# Patient Record
Sex: Male | Born: 2000 | Race: Black or African American | Hispanic: No | Marital: Single | Smoking: Never smoker
Health system: Southern US, Community
[De-identification: ages and names within clinical notes are randomized; demographics above are authoritative.]

## PROBLEM LIST (undated history)

## (undated) DIAGNOSIS — J302 Other seasonal allergic rhinitis: Secondary | ICD-10-CM

## (undated) HISTORY — PX: TYMPANOSTOMY TUBE PLACEMENT: SHX32

## (undated) HISTORY — PX: ADENOIDECTOMY: SHX5191

---
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
## 2022-04-06 ENCOUNTER — Telehealth: Payer: Self-pay | Admitting: Oncology

## 2022-04-06 NOTE — Telephone Encounter (Signed)
Scheduled appt per 7/11 referral. Pt's mother is aware of appt date and time. Pt's mother is aware to arrive 15 mins prior to appt time and to bring and updated insurance card. Pt's mother is aware of appt location.

## 2022-04-15 ENCOUNTER — Telehealth: Payer: Self-pay | Admitting: Hematology and Oncology

## 2022-04-15 ENCOUNTER — Telehealth: Payer: Self-pay | Admitting: Oncology

## 2022-04-15 NOTE — Telephone Encounter (Signed)
Scheduled appt per 7/21 referral. Pt is aware of appt date and time. Pt is aware to arrive 15 mins prior to appt time and to bring and updated insurance card. Pt is aware of appt location.   

## 2022-04-15 NOTE — Telephone Encounter (Signed)
R/s pt's new hem appt. Pt's mother is aware of new appt date/time.

## 2022-04-27 ENCOUNTER — Inpatient Hospital Stay: Payer: Self-pay | Admitting: Oncology

## 2022-05-03 ENCOUNTER — Inpatient Hospital Stay: Payer: Self-pay

## 2022-05-03 ENCOUNTER — Inpatient Hospital Stay: Payer: Self-pay | Attending: Oncology | Admitting: Hematology and Oncology

## 2022-05-03 ENCOUNTER — Other Ambulatory Visit: Payer: Self-pay

## 2022-05-03 DIAGNOSIS — D709 Neutropenia, unspecified: Secondary | ICD-10-CM | POA: Insufficient documentation

## 2022-05-03 LAB — CBC WITH DIFFERENTIAL (CANCER CENTER ONLY)
Abs Immature Granulocytes: 0 10*3/uL (ref 0.00–0.07)
Basophils Absolute: 0 10*3/uL (ref 0.0–0.1)
Basophils Relative: 1 %
Eosinophils Absolute: 0.1 10*3/uL (ref 0.0–0.5)
Eosinophils Relative: 5 %
HCT: 40.1 % (ref 39.0–52.0)
Hemoglobin: 13.1 g/dL (ref 13.0–17.0)
Immature Granulocytes: 0 %
Lymphocytes Relative: 56 %
Lymphs Abs: 1.2 10*3/uL (ref 0.7–4.0)
MCH: 26.8 pg (ref 26.0–34.0)
MCHC: 32.7 g/dL (ref 30.0–36.0)
MCV: 82.2 fL (ref 80.0–100.0)
Monocytes Absolute: 0.2 10*3/uL (ref 0.1–1.0)
Monocytes Relative: 10 %
Neutro Abs: 0.6 10*3/uL — ABNORMAL LOW (ref 1.7–7.7)
Neutrophils Relative %: 28 %
Platelet Count: 224 10*3/uL (ref 150–400)
RBC: 4.88 MIL/uL (ref 4.22–5.81)
RDW: 13.6 % (ref 11.5–15.5)
WBC Count: 2.2 10*3/uL — ABNORMAL LOW (ref 4.0–10.5)
nRBC: 0 % (ref 0.0–0.2)

## 2022-05-03 LAB — FOLATE: Folate: 13.5 ng/mL (ref >5.9)

## 2022-05-03 LAB — VITAMIN B12: Vitamin B-12: 256 pg/mL (ref 180–914)

## 2022-05-03 NOTE — Assessment & Plan Note (Addendum)
Lab review 06/18/2019: WBC 2.6, hemoglobin 13.9, platelets 255, ANC 1000 02/15/2022: WBC 3.5, hemoglobin 13.5, platelets 276, ANC 950, iron saturation 36% 03/08/2022: WBC 3.5, ANC 949  Persistent mild leukopenia/neutropenia: I discussed with the patient the differential diagnosis of leukopenia. Differential diagnosis: 1. Infections: No recent infections but she has had this low white count for at least 3 years or longer. 2. inflammation/autoimmune  3. Nutritional causes but B-12 or folic acid deficiency 4. Medication induced: Patient does not take any medications 5. Bone marrow disorders  Workup today: 1. CBC with differential with smear 2. B-12 and folic acid levels 3. ANA  Telephone visit on Friday to discuss the results of the blood work.

## 2022-05-03 NOTE — Progress Notes (Signed)
[facility] CONSULT NOTE  Patient Care Team: Currie Paris, MD (Inactive) as PCP - General (Emergency Medicine)  CHIEF COMPLAINTS/PURPOSE OF CONSULTATION:  Leukopenia  HISTORY OF PRESENTING ILLNESS:  Wesley Dalton 21 y.o. male is here because of leukopenia.  Patient has been in his usual state of health and on routine lab work was found to have low white blood cell count.  His white blood cell counts were repeated and found to be still low and therefore he was referred to [place] for consultation.  Even 3 years ago he had low white blood cell count with low neutrophils.  He has had no illnesses or infections.  He does not go to school or work but spends most of his time at home.  He has not had any major health issues.  He does not sleep at night and plays video games all night.  He denies any fevers or chills.  He denies any enlarged lymph nodes.  I reviewed her records extensively and collaborated the history with the patient.  MEDICAL HISTORY:  Past Medical History:  Diagnosis Date   Seasonal allergies     SURGICAL HISTORY: Past Surgical History:  Procedure Laterality Date   ADENOIDECTOMY     TYMPANOSTOMY TUBE PLACEMENT      SOCIAL HISTORY: Social History   Socioeconomic History   Marital status: Single    Spouse name: Not on file   Number of children: Not on file   Years of education: Not on file   Highest education level: Not on file  Occupational History   Not on file  Tobacco Use   Smoking status: Passive Smoke Exposure - Never Smoker   Smokeless tobacco: Not on file  Substance and Sexual Activity   Alcohol use: Not on file   Drug use: Not on file   Sexual activity: Not on file  Other Topics Concern   Not on file  Social History Narrative   Not on file   Social Determinants of Health   Financial Resource Strain: Not on file  Food Insecurity: Not on file  Transportation Needs: Not on file  Physical Activity: Not on file  Stress: Not  on file  Social Connections: Not on file  Intimate Partner Violence: Not on file    FAMILY HISTORY: No family history on file.  ALLERGIES:  has No Known Allergies.  MEDICATIONS:  Current Outpatient Medications  Medication Sig Dispense Refill   albuterol (PROVENTIL HFA;VENTOLIN HFA) 108 (90 BASE) MCG/ACT inhaler 1-2 puffs qid prn wheezing Dispense with a peds spacer 1 Inhaler 0   amoxicillin (AMOXIL) 250 MG/5ML suspension Take 15 mLs (750 mg total) by mouth 2 (two) times daily. 750mg  po bid x 10 days qs 300 mL 0   Pseudoeph-Doxylamine-DM-APAP (NYQUIL PO) Take by mouth.     No current facility-administered medications for this visit.    REVIEW OF SYSTEMS:   Constitutional: Denies fevers, chills or abnormal night sweats   All other systems were reviewed with the patient and are negative.  PHYSICAL EXAMINATION: ECOG PERFORMANCE STATUS: 1 - Symptomatic but completely ambulatory  Vitals:   05/03/22 1310  BP: 119/78  Pulse: 81  Resp: 16  Temp: 98.2 F (36.8 C)  SpO2: 97%   Filed Weights   05/03/22 1310  Weight: 125 lb 14.4 oz (57.1 kg)    RADIOGRAPHIC STUDIES: I have personally reviewed the radiological reports and agreed with the findings in the report.  ASSESSMENT AND PLAN:  Neutropenia (HCC) Lab review  06/18/2019: WBC 2.6, hemoglobin 13.9, platelets 255, ANC 1000 02/15/2022: WBC 3.5, hemoglobin 13.5, platelets 276, ANC 950, iron saturation 36% 03/08/2022: WBC 3.5, ANC 949  Persistent mild leukopenia/neutropenia: I discussed with the patient the differential diagnosis of leukopenia. Differential diagnosis: 1. Infections: No recent infections but she has had this low white count for at least 3 years or longer. 2. inflammation/autoimmune  3. Nutritional causes but B-12 or folic acid deficiency 4. Medication induced: Patient does not take any medications 5. Bone marrow disorders  Workup today: 1. CBC with differential with smear 2. B-12 and folic acid levels 3.  ANA  Telephone visit on Friday to discuss the results of the blood work.    All questions were answered. The patient knows to call the clinic with any problems, questions or concerns.    Tamsen Meek, MD 05/03/22

## 2022-05-05 LAB — ANTINUCLEAR ANTIBODIES, IFA: ANA Ab, IFA: NEGATIVE

## 2022-05-06 ENCOUNTER — Other Ambulatory Visit: Payer: Self-pay | Admitting: *Deleted

## 2022-05-06 ENCOUNTER — Inpatient Hospital Stay (HOSPITAL_BASED_OUTPATIENT_CLINIC_OR_DEPARTMENT_OTHER): Payer: Self-pay | Admitting: Hematology and Oncology

## 2022-05-06 DIAGNOSIS — D709 Neutropenia, unspecified: Secondary | ICD-10-CM

## 2022-05-06 NOTE — Assessment & Plan Note (Signed)
Lab review 06/18/2019: WBC 2.6, hemoglobin 13.9, platelets 255, ANC 1000 02/15/2022: WBC 3.5, hemoglobin 13.5, platelets 276, ANC 950, iron saturation 36% 03/08/2022: WBC 3.5, ANC 949 05/03/2022: WBC 2.2, ANC 0.6, ANA negative, folate 13.5, B12 256  Based on worsening neutropenia, I recommend that we do a bone marrow biopsy for further assessment

## 2022-05-06 NOTE — Progress Notes (Signed)
HEMATOLOGY-ONCOLOGY TELEPHONE VISIT PROGRESS NOTE  I connected with our patient on 05/06/22 at 10:45 AM EDT by telephone and verified that I am speaking with the correct person using two identifiers.  I discussed the limitations, risks, security and privacy concerns of performing an evaluation and management service by telephone and the availability of in person appointments.  I also discussed with the patient that there may be a patient responsible charge related to this service. The patient expressed understanding and agreed to proceed.   History of Present Illness: Follow-up to discuss results of blood work  REVIEW OF SYSTEMS:   Constitutional: Denies fevers, chills or abnormal weight loss All other systems were reviewed with the patient and are negative. Observations/Objective:    Assessment Plan:  Neutropenia ([place]) Lab review 06/18/2019: WBC 2.6, hemoglobin 13.9, platelets 255, ANC 1000 02/15/2022: WBC 3.5, hemoglobin 13.5, platelets 276, ANC 950, iron saturation 36% 03/08/2022: WBC 3.5, ANC 949 05/03/2022: WBC 2.2, ANC 0.6, ANA negative, folate 13.5, B12 256  Based on worsening neutropenia, I recommend that we do a bone marrow biopsy for further assessment   I discussed the assessment and treatment plan with the patient. The patient was provided an opportunity to ask questions and all were answered. The patient agreed with the plan and demonstrated an understanding of the instructions. The patient was advised to call back or seek an in-person evaluation if the symptoms worsen or if the condition fails to improve as anticipated.   I provided 12 minutes of non-face-to-face time during this encounter.  This includes time for charting and coordination of care   Harriette Ohara, MD

## 2022-05-06 NOTE — Progress Notes (Signed)
Per MD request, RN scheduled bone marrow biopsy.  Pt notified and verbalized understanding.

## 2022-05-10 ENCOUNTER — Inpatient Hospital Stay: Payer: Self-pay

## 2022-05-10 ENCOUNTER — Other Ambulatory Visit: Payer: Self-pay

## 2022-05-10 ENCOUNTER — Inpatient Hospital Stay (HOSPITAL_BASED_OUTPATIENT_CLINIC_OR_DEPARTMENT_OTHER): Payer: Medicaid Other | Admitting: Hematology and Oncology

## 2022-05-10 DIAGNOSIS — D709 Neutropenia, unspecified: Secondary | ICD-10-CM

## 2022-05-10 LAB — CBC WITH DIFFERENTIAL (CANCER CENTER ONLY)
Abs Immature Granulocytes: 0.01 10*3/uL (ref 0.00–0.07)
Basophils Absolute: 0 10*3/uL (ref 0.0–0.1)
Basophils Relative: 1 %
Eosinophils Absolute: 0.2 10*3/uL (ref 0.0–0.5)
Eosinophils Relative: 4 %
HCT: 39.7 % (ref 39.0–52.0)
Hemoglobin: 13.1 g/dL (ref 13.0–17.0)
Immature Granulocytes: 0 %
Lymphocytes Relative: 56 %
Lymphs Abs: 1.9 10*3/uL (ref 0.7–4.0)
MCH: 26.9 pg (ref 26.0–34.0)
MCHC: 33 g/dL (ref 30.0–36.0)
MCV: 81.5 fL (ref 80.0–100.0)
Monocytes Absolute: 0.3 10*3/uL (ref 0.1–1.0)
Monocytes Relative: 10 %
Neutro Abs: 1 10*3/uL — ABNORMAL LOW (ref 1.7–7.7)
Neutrophils Relative %: 29 %
Platelet Count: 238 10*3/uL (ref 150–400)
RBC: 4.87 MIL/uL (ref 4.22–5.81)
RDW: 13.3 % (ref 11.5–15.5)
WBC Count: 3.4 10*3/uL — ABNORMAL LOW (ref 4.0–10.5)
nRBC: 0 % (ref 0.0–0.2)

## 2022-05-10 MED ORDER — LIDOCAINE HCL 2 % IJ SOLN
INTRAMUSCULAR | Status: AC
  Filled 2022-05-10: qty 20

## 2022-05-10 MED ORDER — LIDOCAINE HCL 2 % IJ SOLN: 20.0000 mL | Freq: Once | INTRAMUSCULAR | Status: DC

## 2022-05-10 NOTE — Patient Instructions (Signed)
Bone Marrow Aspiration and Bone Marrow Biopsy, Adult, Care After This sheet gives you information about how to care for yourself after your procedure. Your health care provider may also give you more specific instructions. If you have problems or questions, contact your health care provider. What can I expect after the procedure? After the procedure, it is common to have: Mild pain and tenderness. Swelling. Bruising. Follow these instructions at home: Puncture site care  Follow instructions from your health care provider about how to take care of the puncture site. Make sure you: Wash your hands with soap and water before and after you change your bandage (dressing). If soap and water are not available, use hand sanitizer. Change your dressing as told by your health care provider. Check your puncture site every day for signs of infection. Check for: More redness, swelling, or pain. Fluid or blood. Warmth. Pus or a bad smell. Activity Return to your normal activities as told by your health care provider. Ask your health care provider what activities are safe for you. Do not lift anything that is heavier than 10 lb (4.5 kg), or the limit that you are told, until your health care provider says that it is safe. Do not drive for 24 hours if you were given a sedative during your procedure. General instructions  Take over-the-counter and prescription medicines only as told by your health care provider. Do not take baths, swim, or use a hot tub until your health care provider approves. Ask your health care provider if you may take showers. You may only be allowed to take sponge baths. If directed, put ice on the affected area. To do this: Put ice in a plastic bag. Place a towel between your skin and the bag. Leave the ice on for 20 minutes, 2-3 times a day. Keep all follow-up visits as told by your health care provider. This is important. Contact a health care provider if: Your pain is not  controlled with medicine. You have a fever. You have more redness, swelling, or pain around the puncture site. You have fluid or blood coming from the puncture site. Your puncture site feels warm to the touch. You have pus or a bad smell coming from the puncture site. Summary After the procedure, it is common to have mild pain, tenderness, swelling, and bruising. Follow instructions from your health care provider about how to take care of the puncture site and what activities are safe for you. Take over-the-counter and prescription medicines only as told by your health care provider. Contact a health care provider if you have any signs of infection, such as fluid or blood coming from the puncture site. This information is not intended to replace advice given to you by your health care provider. Make sure you discuss any questions you have with your health care provider. Document Revised: 01/29/2019 Document Reviewed: 01/29/2019 Elsevier Patient Education  [place].

## 2022-05-10 NOTE — Progress Notes (Signed)
INDICATION: Neutropenia   Bone Marrow Biopsy and Aspiration Procedure Note   Informed consent was obtained and potential risks including bleeding, infection and pain were reviewed with the patient.  The patient's name, date of birth, identification, consent and allergies were verified prior to the start of procedure and time out was performed.  The left posterior iliac crest was chosen as the site of biopsy.  The skin was prepped with ChloraPrep.   8 cc of 1% lidocaine was used to provide local anaesthesia.   10 cc of bone marrow aspirate was obtained followed by 1cm biopsy.  Pressure was applied to the biopsy site and bandage was placed over the biopsy site. Patient was made to lie on the back for 15 mins prior to discharge.  The procedure was tolerated well. COMPLICATIONS: None BLOOD LOSS: none The patient was discharged home in stable condition with a 1 week follow up to review results.  Patient was provided with post bone marrow biopsy instructions and instructed to call if there was any bleeding or worsening pain.  Specimens sent for flow cytometry, cytogenetics and additional studies.  Signed Harriette Ohara, MD

## 2022-05-12 LAB — SURGICAL PATHOLOGY

## 2022-05-17 ENCOUNTER — Encounter (HOSPITAL_COMMUNITY): Payer: Self-pay | Admitting: Hematology and Oncology

## 2022-05-17 ENCOUNTER — Inpatient Hospital Stay: Payer: Self-pay | Admitting: Hematology and Oncology

## 2022-05-17 ENCOUNTER — Inpatient Hospital Stay (HOSPITAL_BASED_OUTPATIENT_CLINIC_OR_DEPARTMENT_OTHER): Payer: Self-pay | Admitting: Hematology and Oncology

## 2022-05-17 DIAGNOSIS — D709 Neutropenia, unspecified: Secondary | ICD-10-CM

## 2022-05-17 NOTE — Progress Notes (Signed)
HEMATOLOGY-ONCOLOGY TELEPHONE VISIT PROGRESS NOTE  I connected with our patient on 05/17/22 at 10:15 AM EDT by telephone and verified that I am speaking with the correct person using two identifiers.  I discussed the limitations, risks, security and privacy concerns of performing an evaluation and management service by telephone and the availability of in person appointments.  I also discussed with the patient that there may be a patient responsible charge related to this service. The patient expressed understanding and agreed to proceed.   History of Present Illness: Follow-up to discuss bone marrow biopsy He tolerated the bone marrow biopsy extremely well without any problems or concerns.  He does not have any frequent infections or illnesses.  REVIEW OF SYSTEMS:   Constitutional: Denies fevers, chills or abnormal weight loss All other systems were reviewed with the patient and are negative. Observations/Objective:     Assessment Plan:  Neutropenia ([place]) Lab review 06/18/2019: WBC 2.6, hemoglobin 13.9, platelets 255, ANC 1000 02/15/2022: WBC 3.5, hemoglobin 13.5, platelets 276, ANC 950, iron saturation 36% 03/08/2022: WBC 3.5, ANC 949 05/03/2022: WBC 2.2, ANC 0.6, ANA negative, folate 13.5, B12 256  05/10/2022: WBC 3.4, ANC 1  Bone marrow biopsy 05/10/2022: Mildly hypercellular bone marrow (60%) with orderly trilineage hematopoiesis, storage iron absent.  There is no evidence of dysplasia.  Essentially normal bone marrow results.  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  There is no further concern for serious bone marrow disorder.  Therefore no treatment is necessary. Return to clinic on an as-needed basis.  I discussed the assessment and treatment plan with the patient. The patient was provided an opportunity to ask questions and all were answered. The patient agreed with the plan and demonstrated an understanding  of the instructions. The patient was advised to call back or seek an in-person evaluation if the symptoms worsen or if the condition fails to improve as anticipated.   I provided 12 minutes of non-face-to-face time during this encounter.  This includes time for charting and coordination of care   Harriette Ohara, MD

## 2022-05-17 NOTE — Assessment & Plan Note (Signed)
Lab review 06/18/2019: WBC 2.6, hemoglobin 13.9, platelets 255, ANC1000 02/15/2022: WBC 3.5, hemoglobin 13.5, platelets 276, ANC 950, iron saturation 36% 03/08/2022: WBC 3.5, ANC 949 05/03/2022: WBC 2.2, ANC 0.6, ANA negative, folate 13.5, B12 256  05/10/2022: WBC 3.4, ANC 1  Bone marrow biopsy 05/10/2022: Mildly hypercellular bone marrow (60%) with orderly trilineage hematopoiesis, storage iron absent.  There is no evidence of dysplasia.  Essentially normal bone marrow results.  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  There is no further concern for serious bone marrow disorder.  Therefore no treatment is necessary.

## 2024-09-30 ENCOUNTER — Other Ambulatory Visit: Payer: Self-pay

## 2024-09-30 ENCOUNTER — Ambulatory Visit
Admission: EM | Admit: 2024-09-30 | Discharge: 2024-09-30 | Disposition: A | Discharge status: Home / Self Care | Attending: Physician Assistant | Admitting: Physician Assistant

## 2024-09-30 DIAGNOSIS — L309 Dermatitis, unspecified: Secondary | ICD-10-CM

## 2024-09-30 MED ORDER — LORATADINE 10 MG PO TABS: 10.0000 mg | ORAL_TABLET | Freq: Every day | ORAL | 1 refills | Status: AC

## 2024-09-30 MED ORDER — PREDNISONE 20 MG PO TABS: ORAL_TABLET | ORAL | 0 refills | Status: AC

## 2024-09-30 MED ORDER — TRIAMCINOLONE ACETONIDE 0.1 % EX CREA: 1.0000 | TOPICAL_CREAM | Freq: Two times a day (BID) | CUTANEOUS | 0 refills | Status: AC

## 2024-09-30 NOTE — ED Provider Notes (Signed)
 [facility]    CSN: [id number] Arrival date & time: 09/30/24  0851      History   Chief Complaint Chief Complaint  Patient presents with   Rash    HPI SHEEHAN STACEY is a 24 y.o. male.   HPI  Pt is here today with concerns of a rash that is rather generalized over the trunk and extremities and has been ongoing for 1-2 months.  He reports that it is itchy and when he scratches it can bleed. He denies blisters or skin breakdown Interventions: Cortisone cream and black seed oil He reports a family hx of eczema but denies personal known hx of this. \ he reports that scented lotion did cause a burning sensation and he needed to wash it off  He has been using cerave moisturizer and aveeno aloe vera moisturizer     Past Medical History:  Diagnosis Date   Seasonal allergies     Patient Active Problem List   Diagnosis Date Noted   Neutropenia 05/03/2022    Past Surgical History:  Procedure Laterality Date   ADENOIDECTOMY     TYMPANOSTOMY TUBE PLACEMENT         Home Medications    Prior to Admission medications  Medication Sig Start Date End Date Taking? Authorizing Provider  loratadine  (CLARITIN ) 10 MG tablet Take 1 tablet (10 mg total) by mouth daily. 09/30/24  Yes Jovonta Levit E, PA-C  predniSONE  (DELTASONE ) 20 MG tablet Take 60mg  PO daily x 2 days, then40mg  PO daily x 2 days, then 20mg  PO daily x 3 days 09/30/24  Yes Emilliano Dilworth E, PA-C  triamcinolone  cream (KENALOG ) 0.1 % Apply 1 Application topically 2 (two) times daily. 09/30/24  Yes Natisha Trzcinski, Rocky BRAVO, PA-C    Family History History reviewed. No pertinent family history.  Social History Social History[1]   Allergies   Patient has no known allergies.   Review of Systems Review of Systems  Constitutional:  Negative for chills and fever.  Respiratory:  Negative for shortness of breath and wheezing.   Skin:  Positive for rash.     Physical Exam Triage Vital Signs ED Triage Vitals  Encounter  Vitals Group     BP 09/30/24 0910 112/73     Girls Systolic BP Percentile --      Girls Diastolic BP Percentile --      Boys Systolic BP Percentile --      Boys Diastolic BP Percentile --      Pulse Rate 09/30/24 0910 80     Resp 09/30/24 0910 18     Temp 09/30/24 0910 97.9 F (36.6 C)     Temp Source 09/30/24 0910 Oral     SpO2 09/30/24 0910 96 %     Weight --      Height 09/30/24 0908 6' (1.829 m)     Head Circumference --      Peak Flow --      Pain Score 09/30/24 0908 6     Pain Loc --      Pain Education --      Exclude from Growth Chart --    No data found.  Updated Vital Signs BP 112/73 (BP Location: Right Arm)   Pulse 80   Temp 97.9 F (36.6 C) (Oral)   Resp 18   Ht 6' (1.829 m)   SpO2 96%   BMI 17.08 kg/m   Visual Acuity Right Eye Distance:   Left Eye Distance:   Bilateral  Distance:    Right Eye Near:   Left Eye Near:    Bilateral Near:     Physical Exam Vitals reviewed.  Constitutional:      General: He is awake.     Appearance: Normal appearance. He is well-developed and well-groomed.  HENT:     Head: Normocephalic and atraumatic.  Eyes:     Extraocular Movements: Extraocular movements intact.     Conjunctiva/sclera: Conjunctivae normal.  Pulmonary:     Effort: Pulmonary effort is normal.  Musculoskeletal:     Cervical back: Normal range of motion.  Skin:    Findings: Rash present. Rash is macular, papular and scaling.     Comments: Pt has scaling maculopapular rash along the back of the neck, dorsum of hands, behind the knees consistent with eczema   Neurological:     Mental Status: He is alert and oriented to person, place, and time.  Psychiatric:        Attention and Perception: Attention normal.        Mood and Affect: Mood normal.        Speech: Speech normal.        Behavior: Behavior normal. Behavior is cooperative.      UC Treatments / Results  Labs (all labs ordered are listed, but only abnormal results are displayed) Labs  Reviewed - No data to display  EKG   Radiology No results found.  Procedures Procedures (including critical care time)  Medications Ordered in UC Medications - No data to display  Initial Impression / Assessment and Plan / UC Course  I have reviewed the triage vital signs and the nursing notes.  Pertinent labs & imaging results that were available during my care of the patient were reviewed by me and considered in my medical decision making (see chart for details).      Final Clinical Impressions(s) / UC Diagnoses   Final diagnoses:  Eczema, unspecified type  Patient is here today with concerns of an itchy rash has been ongoing for 1 to 2 months.  He reports that he has been applying cortisone cream as well as flaxseed oil which has led to some improvement.  Physical exam is notable for maculopapular, scaling plaques along the back of the neck, backs of the hands and the knees consistent with eczema.  Since symptoms have been ongoing for so long and lack systemic improvement we will start steroid taper and send him home with Kenalog  cream.  Reviewed home measures to further assist with symptom management and recommend follow-up with PCP especially if symptoms are not improving or seem to be worsening.   Discharge Instructions      You were seen today for concerns of eczema. This is an itchy rash on the skin that can present in the colder months and become more severe if not managed appropriately.  To help with your symptoms I am sending in a prescription for a prednisone  taper for you to take for a week per the instructions on the package. I have sent in a script for Prednisone  taper to be taken in the morning with breakfast per the instructions on the container Remember that steroids can cause sleeplessness, irritability, increased hunger and elevated glucose levels so be mindful of these side effects. They should lessen as you progress to the lower doses of the taper.  I am  also sending in a steroid cream for you to use on your skin as needed to help with itching.  Please do  not use this for more than 2 weeks on the same spot.  The steroid cream should not be used on the face, genital area or in the armpits.  To help with your symptoms I recommend using detergents, lotions, soaps that are sensitive skin formulas which means they are usually dye free, fragrance free and not irritating.  Please try taking lukewarm to warm showers as hot showers can irritate your skin further. To help moisturize your skin I recommend using a thick, occlusive ointment such as Aquaphor or Vaseline as this will help break up some of the eczema plaques on your skin.  If you feel your symptoms are not improving or seem to be worsening please follow-up with your primary care provider.     ED Prescriptions     Medication Sig Dispense Auth. Provider   predniSONE  (DELTASONE ) 20 MG tablet Take 60mg  PO daily x 2 days, then40mg  PO daily x 2 days, then 20mg  PO daily x 3 days 13 tablet Jeda Pardue E, PA-C   triamcinolone  cream (KENALOG ) 0.1 % Apply 1 Application topically 2 (two) times daily. 30 g Pearson Picou E, PA-C   loratadine  (CLARITIN ) 10 MG tablet Take 1 tablet (10 mg total) by mouth daily. 30 tablet Bessie Livingood E, PA-C      PDMP not reviewed this encounter.     [1]  Social History Tobacco Use   Smoking status: Never    Passive exposure: Yes   Smokeless tobacco: Never  Vaping Use   Vaping status: Never Used  Substance Use Topics   Alcohol use: Never   Drug use: Never     Marylene Rocky BRAVO, PA-C 09/30/24 0953  "

## 2024-09-30 NOTE — ED Triage Notes (Signed)
 Pt presents with a chief complaint of generalized rash x 1-2 months. States it itches very badly. Rates overall pain/discomfort a 6/10. Has been applying cortisone cream to the skin + black seed oil (topical/oral) with improvement. Does report he uses many different detergents for his clothing.

## 2024-09-30 NOTE — Discharge Instructions (Addendum)
 You were seen today for concerns of eczema. This is an itchy rash on the skin that can present in the colder months and become more severe if not managed appropriately.  To help with your symptoms I am sending in a prescription for a prednisone  taper for you to take for a week per the instructions on the package. I have sent in a script for Prednisone  taper to be taken in the morning with breakfast per the instructions on the container Remember that steroids can cause sleeplessness, irritability, increased hunger and elevated glucose levels so be mindful of these side effects. They should lessen as you progress to the lower doses of the taper.  I am also sending in a steroid cream for you to use on your skin as needed to help with itching.  Please do not use this for more than 2 weeks on the same spot.  The steroid cream should not be used on the face, genital area or in the armpits.  To help with your symptoms I recommend using detergents, lotions, soaps that are sensitive skin formulas which means they are usually dye free, fragrance free and not irritating.  Please try taking lukewarm to warm showers as hot showers can irritate your skin further. To help moisturize your skin I recommend using a thick, occlusive ointment such as Aquaphor or Vaseline as this will help break up some of the eczema plaques on your skin.  If you feel your symptoms are not improving or seem to be worsening please follow-up with your primary care provider.
